# Patient Record
Sex: Male | Born: 1995 | Race: Black or African American | Hispanic: No | Marital: Single | Smoking: Current every day smoker
Health system: Southern US, Community
[De-identification: ages and names within clinical notes are randomized; demographics above are authoritative.]

## PROBLEM LIST (undated history)

## (undated) HISTORY — PX: VALVE REPLACEMENT: SUR13

## (undated) HISTORY — PX: CARDIAC SURGERY: SHX584

---
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
## 2022-09-19 ENCOUNTER — Emergency Department
Admission: EM | Admit: 2022-09-19 | Discharge: 2022-09-19 | Disposition: A | Discharge status: Home / Self Care | Payer: Self-pay | Attending: Student in an Organized Health Care Education/Training Program | Admitting: Student in an Organized Health Care Education/Training Program

## 2022-09-19 ENCOUNTER — Emergency Department: Payer: Self-pay

## 2022-09-19 ENCOUNTER — Other Ambulatory Visit: Payer: Self-pay

## 2022-09-19 ENCOUNTER — Encounter: Payer: Self-pay | Admitting: Emergency Medicine

## 2022-09-19 DIAGNOSIS — R200 Anesthesia of skin: Secondary | ICD-10-CM | POA: Insufficient documentation

## 2022-09-19 DIAGNOSIS — M79641 Pain in right hand: Secondary | ICD-10-CM | POA: Insufficient documentation

## 2022-09-19 MED ORDER — NAPROXEN 500 MG PO TABS: 500.0000 mg | ORAL_TABLET | Freq: Two times a day (BID) | ORAL | 0 refills | Status: DC

## 2022-09-19 NOTE — ED Provider Notes (Signed)
[facility] Provider Note    Event Date/Time   First MD Initiated Contact with Patient 09/19/22 [phone]     (approximate)   History   Hand Pain   HPI  Andrew Ashley is a 26 y.o. male   presents to the ED with complaint of right hand pain for 1 week.  Patient is not aware of any injury but states that he uses his hands constantly while working.  He states that there is some numbness noted at night.  He has been taking Tylenol without any relief.  He denies any health problems.      Physical Exam   Triage Vital Signs: ED Triage Vitals  Enc Vitals Group     BP --      Pulse Rate 09/19/22 0903 84     Resp 09/19/22 0903 17     Temp 09/19/22 0903 98.2 F (36.8 C)     Temp Source 09/19/22 0903 Oral     SpO2 09/19/22 0903 100 %     Weight 09/19/22 0833 126 lb 15.8 oz (57.6 kg)     Height 09/19/22 0833 5\' 5"  (1.651 m)     Head Circumference --      Peak Flow --      Pain Score 09/19/22 0833 6     Pain Loc --      Pain Edu? --      Excl. in GC? --     Most recent vital signs: Vitals:   09/19/22 0903  Pulse: 84  Resp: 17  Temp: 98.2 F (36.8 C)  SpO2: 100%     General: Awake, no distress.  CV:  Good peripheral perfusion.  Resp:  Normal effort.  Abd:  No distention.  Other:  Right hand deformity, soft tissue edema, erythema, cuts or abrasions.  Radial pulse present.  Capillary refills less than 3 seconds.  Good muscle strength and grip at 5/5 bilaterally.  Patient is able move all digits without any difficulty with flexion and extension.  Tinel's sign is negative.   ED Results / Procedures / Treatments   Labs (all labs ordered are listed, but only abnormal results are displayed) Labs Reviewed - No data to display    RADIOLOGY  X-rays of the right hand was reviewed and interpreted by myself with no acute bony injury noted.  Official radiology report is mild soft tissue edema distal phalanx and also an old fracture to the base of the  proximal fourth phalanx.   PROCEDURES:  Critical Care performed:   Procedures   MEDICATIONS ORDERED IN ED: Medications - No data to display   IMPRESSION / MDM / ASSESSMENT AND PLAN / ED COURSE  I reviewed the triage vital signs and the nursing notes.   Differential diagnosis includes, but is not limited to, right hand pain, right hand strain, carpal tunnel syndrome, osteoarthritis, foreign body.  26 year old male presents to the ED with complaint of right hand pain without history of injury.  X-ray is negative for acute fracture.  Patient was given a prescription for naproxen 500 mg twice daily with food and he is instructed to follow-up with Dr. 25 who is the orthopedist on call today for unassigned.  Contact information and address was given to him.      Patient's presentation is most consistent with acute complicated illness / injury requiring diagnostic workup.  FINAL CLINICAL IMPRESSION(S) / ED DIAGNOSES   Final diagnoses:  Right hand pain  Rx / DC Orders   ED Discharge Orders          Ordered    naproxen (NAPROSYN) 500 MG tablet  2 times daily with meals        09/19/22 1004             Note:  This document was prepared using Dragon voice recognition software and may include unintentional dictation errors.   Johnn Hai, PA-C 09/19/22 1436    Merlyn Lot, MD 09/19/22 [phone]

## 2022-09-19 NOTE — Discharge Instructions (Addendum)
Follow-up with Dr. Karel Jarvis who is on-call today for orthopedics.  His office is located in [facility] and his address and phone number listed on your discharge papers.  Begin taking naproxen 500 mg twice daily with food daily for the next 10 days.  You may take Tylenol if additional pain medication as needed.

## 2022-09-19 NOTE — ED Notes (Signed)
Patient transported to X-ray 

## 2022-09-19 NOTE — ED Notes (Signed)
See triage note. Pt a/o with c/o right #3 finger numbness. Pt also has right palm pain at night. +2 right radial pulse. Pt states that finger is swollen. Hard to see - maybe slightly. Denies injury. Does pulling and lifting at work.

## 2022-09-19 NOTE — ED Triage Notes (Signed)
C/O right hand pain.  Patient denies injury, but states he uses his hands a lot with tools while working.  States pain has been ongoing, worse at night.  No swelling appreciated. + CMS

## 2022-09-29 ENCOUNTER — Other Ambulatory Visit: Payer: Self-pay

## 2022-09-29 ENCOUNTER — Encounter (HOSPITAL_BASED_OUTPATIENT_CLINIC_OR_DEPARTMENT_OTHER): Payer: Self-pay

## 2022-09-29 ENCOUNTER — Emergency Department (HOSPITAL_BASED_OUTPATIENT_CLINIC_OR_DEPARTMENT_OTHER)
Admission: EM | Admit: 2022-09-29 | Discharge: 2022-09-29 | Discharge status: Against Medical Advice | Payer: No Typology Code available for payment source | Attending: Emergency Medicine | Admitting: Emergency Medicine

## 2022-09-29 DIAGNOSIS — R1110 Vomiting, unspecified: Secondary | ICD-10-CM | POA: Diagnosis not present

## 2022-09-29 DIAGNOSIS — Z5321 Procedure and treatment not carried out due to patient leaving prior to being seen by health care provider: Secondary | ICD-10-CM | POA: Insufficient documentation

## 2022-09-29 DIAGNOSIS — Y92410 Unspecified street and highway as the place of occurrence of the external cause: Secondary | ICD-10-CM | POA: Insufficient documentation

## 2022-09-29 DIAGNOSIS — M542 Cervicalgia: Secondary | ICD-10-CM | POA: Insufficient documentation

## 2022-09-29 DIAGNOSIS — M545 Low back pain, unspecified: Secondary | ICD-10-CM | POA: Insufficient documentation

## 2022-09-29 NOTE — ED Notes (Signed)
Called patient x3 in the lobby, no answer. Notified by registration that patient left ED

## 2022-09-29 NOTE — ED Provider Notes (Signed)
This patient was electronically tracked back to her room however is never present in the room.  He was not evaluated by me and left prior to evaluation.   Sherrell Puller, PA-C 09/29/22 Hardeman, [place], DO 09/29/22 1523
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
## 2022-09-30 ENCOUNTER — Emergency Department (HOSPITAL_BASED_OUTPATIENT_CLINIC_OR_DEPARTMENT_OTHER): Payer: No Typology Code available for payment source | Admitting: Radiology

## 2022-09-30 ENCOUNTER — Emergency Department (HOSPITAL_COMMUNITY)
Admission: EM | Admit: 2022-09-30 | Discharge: 2022-09-30 | Discharge status: Against Medical Advice | Payer: No Typology Code available for payment source | Attending: Emergency Medicine | Admitting: Emergency Medicine

## 2022-09-30 ENCOUNTER — Encounter (HOSPITAL_BASED_OUTPATIENT_CLINIC_OR_DEPARTMENT_OTHER): Payer: Self-pay

## 2022-09-30 ENCOUNTER — Emergency Department (HOSPITAL_BASED_OUTPATIENT_CLINIC_OR_DEPARTMENT_OTHER)
Admission: EM | Admit: 2022-09-30 | Discharge: 2022-09-30 | Disposition: A | Discharge status: Home / Self Care | Payer: No Typology Code available for payment source | Attending: Emergency Medicine | Admitting: Emergency Medicine

## 2022-09-30 ENCOUNTER — Other Ambulatory Visit: Payer: Self-pay

## 2022-09-30 DIAGNOSIS — R112 Nausea with vomiting, unspecified: Secondary | ICD-10-CM | POA: Diagnosis not present

## 2022-09-30 DIAGNOSIS — Y92410 Unspecified street and highway as the place of occurrence of the external cause: Secondary | ICD-10-CM | POA: Diagnosis not present

## 2022-09-30 DIAGNOSIS — M542 Cervicalgia: Secondary | ICD-10-CM | POA: Diagnosis not present

## 2022-09-30 DIAGNOSIS — M545 Low back pain, unspecified: Secondary | ICD-10-CM | POA: Diagnosis present

## 2022-09-30 DIAGNOSIS — Z5321 Procedure and treatment not carried out due to patient leaving prior to being seen by health care provider: Secondary | ICD-10-CM | POA: Diagnosis not present

## 2022-09-30 DIAGNOSIS — S39012A Strain of muscle, fascia and tendon of lower back, initial encounter: Secondary | ICD-10-CM | POA: Diagnosis not present

## 2022-09-30 DIAGNOSIS — S161XXA Strain of muscle, fascia and tendon at neck level, initial encounter: Secondary | ICD-10-CM

## 2022-09-30 DIAGNOSIS — S199XXA Unspecified injury of neck, initial encounter: Secondary | ICD-10-CM | POA: Diagnosis present

## 2022-09-30 LAB — CBC
HCT: 45.9 % (ref 39.0–52.0)
Hemoglobin: 14.7 g/dL (ref 13.0–17.0)
MCH: 26.7 pg (ref 26.0–34.0)
MCHC: 32 g/dL (ref 30.0–36.0)
MCV: 83.5 fL (ref 80.0–100.0)
Platelets: 281 10*3/uL (ref 150–400)
RBC: 5.5 MIL/uL (ref 4.22–5.81)
RDW: 13.1 % (ref 11.5–15.5)
WBC: 4.4 10*3/uL (ref 4.0–10.5)
nRBC: 0 % (ref 0.0–0.2)

## 2022-09-30 LAB — COMPREHENSIVE METABOLIC PANEL
ALT: 25 U/L (ref 0–44)
AST: 23 U/L (ref 15–41)
Albumin: 4.1 g/dL (ref 3.5–5.0)
Alkaline Phosphatase: 59 U/L (ref 38–126)
Anion gap: 7 (ref 5–15)
BUN: 13 mg/dL (ref 6–20)
CO2: 24 mmol/L (ref 22–32)
Calcium: 9.6 mg/dL (ref 8.9–10.3)
Chloride: 108 mmol/L (ref 98–111)
Creatinine, Ser: 0.92 mg/dL (ref 0.61–1.24)
GFR, Estimated: >60 mL/min (ref >60)
Glucose, Bld: 116 mg/dL — ABNORMAL HIGH (ref 70–99)
Potassium: 4 mmol/L (ref 3.5–5.1)
Sodium: 139 mmol/L (ref 135–145)
Total Bilirubin: 0.6 mg/dL (ref 0.3–1.2)
Total Protein: 6.9 g/dL (ref 6.5–8.1)

## 2022-09-30 LAB — URINALYSIS, ROUTINE W REFLEX MICROSCOPIC
Bilirubin Urine: NEGATIVE
Glucose, UA: NEGATIVE mg/dL
Hgb urine dipstick: NEGATIVE
Ketones, ur: NEGATIVE mg/dL
Leukocytes,Ua: NEGATIVE
Nitrite: NEGATIVE
Protein, ur: NEGATIVE mg/dL
Specific Gravity, Urine: 1.024 (ref 1.005–1.030)
pH: 5 (ref 5.0–8.0)

## 2022-09-30 LAB — LIPASE, BLOOD: Lipase: 42 U/L (ref 11–51)

## 2022-09-30 MED ORDER — CYCLOBENZAPRINE HCL 10 MG PO TABS: 10.0000 mg | ORAL_TABLET | Freq: Two times a day (BID) | ORAL | 0 refills | Status: AC | PRN

## 2022-09-30 MED ORDER — DICLOFENAC SODIUM 50 MG PO TBEC: 50.0000 mg | DELAYED_RELEASE_TABLET | Freq: Two times a day (BID) | ORAL | 0 refills | Status: AC
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
## 2022-09-30 NOTE — ED Notes (Signed)
Patient verbalizes understanding of discharge instructions. Opportunity for questioning and answers were provided. Patient discharged from ED.  °
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
## 2022-09-30 NOTE — Discharge Instructions (Signed)
Flexeril as needed as prescribed for muscle tightness, do not drive or operate machinery while taking this medication.  Discontinue naproxen, do not take other NSAIDs while taking diclofenac.  Diclofenac as needed as prescribed for muscle soreness.  Can apply warm compresses for 20 minutes at a time followed gentle stretching.  Recheck with your doctor if not improving.

## 2023-07-07 ENCOUNTER — Emergency Department (HOSPITAL_BASED_OUTPATIENT_CLINIC_OR_DEPARTMENT_OTHER)
Admission: EM | Admit: 2023-07-07 | Discharge: 2023-07-07 | Disposition: A | Discharge status: Home / Self Care | Payer: Self-pay | Attending: Emergency Medicine | Admitting: Emergency Medicine

## 2023-07-07 ENCOUNTER — Other Ambulatory Visit: Payer: Self-pay

## 2023-07-07 ENCOUNTER — Encounter (HOSPITAL_BASED_OUTPATIENT_CLINIC_OR_DEPARTMENT_OTHER): Payer: Self-pay | Admitting: Emergency Medicine

## 2023-07-07 DIAGNOSIS — R531 Weakness: Secondary | ICD-10-CM | POA: Insufficient documentation

## 2023-07-07 DIAGNOSIS — F17290 Nicotine dependence, other tobacco product, uncomplicated: Secondary | ICD-10-CM | POA: Insufficient documentation

## 2023-07-07 DIAGNOSIS — M791 Myalgia, unspecified site: Secondary | ICD-10-CM | POA: Insufficient documentation

## 2023-07-07 DIAGNOSIS — R5383 Other fatigue: Secondary | ICD-10-CM | POA: Insufficient documentation

## 2023-07-07 DIAGNOSIS — F1193 Opioid use, unspecified with withdrawal: Secondary | ICD-10-CM

## 2023-07-07 DIAGNOSIS — R197 Diarrhea, unspecified: Secondary | ICD-10-CM | POA: Insufficient documentation

## 2023-07-07 DIAGNOSIS — F1113 Opioid abuse with withdrawal: Secondary | ICD-10-CM | POA: Insufficient documentation

## 2023-07-07 DIAGNOSIS — F419 Anxiety disorder, unspecified: Secondary | ICD-10-CM | POA: Insufficient documentation

## 2023-07-07 DIAGNOSIS — R112 Nausea with vomiting, unspecified: Secondary | ICD-10-CM | POA: Insufficient documentation

## 2023-07-07 MED ORDER — BUPRENORPHINE HCL-NALOXONE HCL 2-0.5 MG SL SUBL
1.0000 | SUBLINGUAL_TABLET | Freq: Every day | SUBLINGUAL | Status: DC
  Administered 2023-07-07: 1 via SUBLINGUAL
  Filled 2023-07-07: qty 1

## 2023-07-07 MED ORDER — BUPRENORPHINE HCL-NALOXONE HCL 2-0.5 MG SL FILM: 1.0000 | ORAL_FILM | Freq: Two times a day (BID) | SUBLINGUAL | 0 refills | Status: AC | PRN

## 2023-07-07 MED ORDER — LOPERAMIDE HCL 2 MG PO CAPS: 2.0000 mg | ORAL_CAPSULE | Freq: Four times a day (QID) | ORAL | 0 refills | Status: AC | PRN

## 2023-07-07 MED ORDER — ONDANSETRON 4 MG PO TBDP
8.0000 mg | ORAL_TABLET | Freq: Once | ORAL | Status: AC
  Administered 2023-07-07: 8 mg via ORAL
  Filled 2023-07-07: qty 2

## 2023-07-07 MED ORDER — ONDANSETRON HCL 4 MG PO TABS: 4.0000 mg | ORAL_TABLET | Freq: Three times a day (TID) | ORAL | 0 refills | Status: AC | PRN

## 2023-07-07 NOTE — ED Triage Notes (Signed)
Pt arrived POV with withdrawals. Pt has been taking 4-5 Oxy since January and has quit since Friday. C/o N/V/D, chills, achy, lethargic-worsening last night.

## 2023-07-07 NOTE — Discharge Instructions (Addendum)
We evaluated you for your opiate withdrawal symptoms.  Please take Suboxone twice daily as needed for withdrawal symptoms such as body aches, cravings or feeling ill.  Please follow-up with the outpatient resources we have given you.  Even though you want to quit, it can be difficult and it is very easy to relapse.  Following up with treatment centers can help reduce the risk of relapse.  I have also prescribed you medicine for nausea and diarrhea.  You can take Zofran every 8 hours as needed for nausea and Imodium every 6 hours as needed for diarrhea.

## 2023-07-07 NOTE — ED Provider Notes (Signed)
[facility] AT [facility] Provider Note  CSN: [id number] Arrival date & time: 07/07/23 4098  Chief Complaint(s) Withdrawal  HPI Andrew Ashley is a 27 y.o. male presenting to the emergency department with withdrawal symptoms.  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  He started taking oxycodone obtained from a friend to help work go by quicker.  He reports that for the past 5 months he has been taking initially 4 to 5 tablets of 10 mg oxycodone daily in the last month or so he has been taking 30 mg tablets.  He realized that he was developing an addiction and decided he wanted to quit.  Reports has not taken anything since Friday.  Reports nausea, vomiting, diarrhea, body aches, yawning, fatigue, anxiety, generalized weakness.  No fevers or chills, chest pain, abdominal pain, other symptoms.  Never used IV drugs or snorted only on the oral pills.   Past Medical History History reviewed. No pertinent past medical history. There are no problems to display for this patient.  Home Medication(s) Prior to Admission medications   Medication Sig Start Date End Date Taking? Authorizing Provider  Buprenorphine HCl-Naloxone HCl (SUBOXONE) 2-0.5 MG FILM Place 1 Film under the tongue 2 (two) times daily as needed (withdrawal symptoms). 07/07/23  Yes Lonell Grandchild, MD  loperamide (IMODIUM) 2 MG capsule Take 1 capsule (2 mg total) by mouth 4 (four) times daily as needed for diarrhea or loose stools. 07/07/23  Yes Lonell Grandchild, MD  ondansetron (ZOFRAN) 4 MG tablet Take 1 tablet (4 mg total) by mouth every 8 (eight) hours as needed for nausea or vomiting. 07/07/23  Yes Lonell Grandchild, MD  cyclobenzaprine (FLEXERIL) 10 MG tablet Take 1 tablet (10 mg total) by mouth 2 (two) times daily as needed for muscle spasms. 09/30/22   Jeannie Fend, PA-C  diclofenac (VOLTAREN) 50 MG EC tablet Take 1 tablet (50 mg total) by mouth 2 (two) times  daily. 09/30/22   Alden Hipp                                                                                                                                    Past Surgical History Past Surgical History:  Procedure Laterality Date   CARDIAC SURGERY     VALVE REPLACEMENT     Family History History reviewed. No pertinent family history.  Social History Social History   Tobacco Use   Smoking status: Every Day    Types: Cigars   Smokeless tobacco: Never  Vaping Use   Vaping status: Some Days  Substance Use Topics   Alcohol use: No   Drug use: Yes    Types: Marijuana   Allergies Patient has no known allergies.  Review of Systems Review of Systems  All other systems reviewed and are negative.   Physical Exam Vital Signs  I have reviewed the triage vital signs BP 120/76   Pulse 61   Temp 98.4 F (36.9 C) (Temporal)   Resp 16   Ht 5\' 9"  (1.753 m)   Wt 61.2 kg   SpO2 100%   BMI 19.94 kg/m  Physical Exam Vitals and nursing note reviewed.  Constitutional:      General: He is not in acute distress.    Appearance: Normal appearance.  HENT:     Mouth/Throat:     Mouth: Mucous membranes are moist.  Eyes:     Conjunctiva/sclera: Conjunctivae normal.  Cardiovascular:     Rate and Rhythm: Normal rate and regular rhythm.  Pulmonary:     Effort: Pulmonary effort is normal. No respiratory distress.     Breath sounds: Normal breath sounds.  Abdominal:     General: Abdomen is flat.     Palpations: Abdomen is soft.     Tenderness: There is no abdominal tenderness.  Musculoskeletal:     Right lower leg: No edema.     Left lower leg: No edema.  Skin:    General: Skin is warm and dry.     Capillary Refill: Capillary refill takes less than 2 seconds.  Neurological:     Mental Status: He is alert and oriented to person, place, and time. Mental status is at baseline.  Psychiatric:        Mood and Affect: Mood normal.        Behavior: Behavior normal.      ED Results and Treatments Labs (all labs ordered are listed, but only abnormal results are displayed) Labs Reviewed - No data to display                                                                                                                        Radiology No results found.  Pertinent labs & imaging results that were available during my care of the patient were reviewed by me and considered in my medical decision making (see MDM for details).  Medications Ordered in ED Medications  buprenorphine-naloxone (SUBOXONE) 2-0.5 mg per SL tablet 1 tablet (1 tablet Sublingual Given 07/07/23 0858)  ondansetron (ZOFRAN-ODT) disintegrating tablet 8 mg (8 mg Oral Given 07/07/23 0858)  Procedures Procedures  (including critical care time)  Medical Decision Making / ED Course   MDM:  27 year old male presenting to the emergency department with opiate withdrawal symptoms.  Patient well-appearing, physical exam reassuring.  Symptoms are consistent with opioid withdrawal.  He is here with his mother who corroborates his story.  Reviewed PDMP which is negative.  Patient reports he is getting his medication from a friend.  Gave patient dose of Suboxone and Zofran emergency department with significant improvement in symptoms.  No precipitated withdrawal.   Will prescribe Suboxone.  Discussed safe use of Suboxone.  Advised outpatient follow-up.  Provided resources for opioid addiction and treatment.  Will discharge patient to home. All questions answered. Patient comfortable with plan of discharge. Return precautions discussed with patient and specified on the after visit summary.      Medicines ordered and prescription drug management: Meds ordered this encounter  Medications   buprenorphine-naloxone (SUBOXONE) 2-0.5 mg per SL tablet 1 tablet    ondansetron (ZOFRAN-ODT) disintegrating tablet 8 mg   Buprenorphine HCl-Naloxone HCl (SUBOXONE) 2-0.5 MG FILM    Sig: Place 1 Film under the tongue 2 (two) times daily as needed (withdrawal symptoms).    Dispense:  21 each    Refill:  0   ondansetron (ZOFRAN) 4 MG tablet    Sig: Take 1 tablet (4 mg total) by mouth every 8 (eight) hours as needed for nausea or vomiting.    Dispense:  20 tablet    Refill:  0   loperamide (IMODIUM) 2 MG capsule    Sig: Take 1 capsule (2 mg total) by mouth 4 (four) times daily as needed for diarrhea or loose stools.    Dispense:  12 capsule    Refill:  0    -I have reviewed the patients home medicines and have made adjustments as needed  Social Determinants of Health:  Diagnosis or treatment significantly limited by social determinants of health: opioid abuse   Reevaluation: After the interventions noted above, I reevaluated the patient and found that their symptoms have improved  Co morbidities that complicate the patient evaluation History reviewed. No pertinent past medical history.    Dispostion: Disposition decision including need for hospitalization was considered, and patient discharged from emergency department.    Final Clinical Impression(s) / ED Diagnoses Final diagnoses:  Opioid withdrawal (HCC)     This chart was dictated using voice recognition software.  Despite best efforts to proofread,  errors can occur which can change the documentation meaning.    Lonell Grandchild, MD 07/07/23 [phone]

## 2023-07-07 NOTE — ED Notes (Signed)
Pt given discharge instructions and reviewed prescriptions. Opportunities given for questions. Pt verbalizes understanding. Sayla Golonka R, RN 

## 2023-09-20 ENCOUNTER — Emergency Department (HOSPITAL_COMMUNITY)
Admission: EM | Admit: 2023-09-20 | Discharge: 2023-09-21 | Discharge status: Against Medical Advice | Payer: Self-pay | Attending: Emergency Medicine | Admitting: Emergency Medicine

## 2023-09-20 ENCOUNTER — Encounter (HOSPITAL_COMMUNITY): Payer: Self-pay | Admitting: Emergency Medicine

## 2023-09-20 DIAGNOSIS — R112 Nausea with vomiting, unspecified: Secondary | ICD-10-CM | POA: Insufficient documentation

## 2023-09-20 DIAGNOSIS — Z5321 Procedure and treatment not carried out due to patient leaving prior to being seen by health care provider: Secondary | ICD-10-CM | POA: Insufficient documentation

## 2023-09-20 DIAGNOSIS — R1010 Upper abdominal pain, unspecified: Secondary | ICD-10-CM | POA: Insufficient documentation

## 2023-09-20 LAB — CBC WITH DIFFERENTIAL/PLATELET
Abs Immature Granulocytes: 0.01 10*3/uL (ref 0.00–0.07)
Basophils Absolute: 0 10*3/uL (ref 0.0–0.1)
Basophils Relative: 1 %
Eosinophils Absolute: 0 10*3/uL (ref 0.0–0.5)
Eosinophils Relative: 1 %
HCT: 42.8 % (ref 39.0–52.0)
Hemoglobin: 14.1 g/dL (ref 13.0–17.0)
Immature Granulocytes: 0 %
Lymphocytes Relative: 22 %
Lymphs Abs: 1.2 10*3/uL (ref 0.7–4.0)
MCH: 26.4 pg (ref 26.0–34.0)
MCHC: 32.9 g/dL (ref 30.0–36.0)
MCV: 80 fL (ref 80.0–100.0)
Monocytes Absolute: 0.3 10*3/uL (ref 0.1–1.0)
Monocytes Relative: 5 %
Neutro Abs: 3.9 10*3/uL (ref 1.7–7.7)
Neutrophils Relative %: 71 %
Platelets: 266 10*3/uL (ref 150–400)
RBC: 5.35 MIL/uL (ref 4.22–5.81)
RDW: 12.7 % (ref 11.5–15.5)
WBC: 5.5 10*3/uL (ref 4.0–10.5)
nRBC: 0 % (ref 0.0–0.2)

## 2023-09-20 LAB — ETHANOL: Alcohol, Ethyl (B): <10 mg/dL (ref <10)

## 2023-09-20 LAB — COMPREHENSIVE METABOLIC PANEL
ALT: 24 U/L (ref 0–44)
AST: 24 U/L (ref 15–41)
Albumin: 4.4 g/dL (ref 3.5–5.0)
Alkaline Phosphatase: 49 U/L (ref 38–126)
Anion gap: 11 (ref 5–15)
BUN: 12 mg/dL (ref 6–20)
CO2: 23 mmol/L (ref 22–32)
Calcium: 10 mg/dL (ref 8.9–10.3)
Chloride: 108 mmol/L (ref 98–111)
Creatinine, Ser: 0.78 mg/dL (ref 0.61–1.24)
GFR, Estimated: >60 mL/min (ref >60)
Glucose, Bld: 117 mg/dL — ABNORMAL HIGH (ref 70–99)
Potassium: 3.8 mmol/L (ref 3.5–5.1)
Sodium: 142 mmol/L (ref 135–145)
Total Bilirubin: 1.5 mg/dL — ABNORMAL HIGH (ref 0.3–1.2)
Total Protein: 7.4 g/dL (ref 6.5–8.1)

## 2023-09-20 LAB — LIPASE, BLOOD: Lipase: 24 U/L (ref 11–51)

## 2023-09-20 NOTE — ED Triage Notes (Signed)
Pt here from home with c/o upper abd pain along with some n/v , missed three days of his subutex

## 2023-09-20 NOTE — ED Notes (Signed)
Pt unable to void at this time. 

## 2023-09-21 NOTE — ED Notes (Signed)
Pt called X 3 for vitals recheck. Pt could not be found.
